# Patient Record
Sex: Female | Born: 1970 | Race: Black or African American | Hispanic: No | Marital: Single | Smoking: Current every day smoker
Health system: Southern US, Community
[De-identification: ages and names within clinical notes are randomized; demographics above are authoritative.]

## PROBLEM LIST (undated history)

## (undated) DIAGNOSIS — I10 Essential (primary) hypertension: Secondary | ICD-10-CM

## (undated) DIAGNOSIS — M1990 Unspecified osteoarthritis, unspecified site: Secondary | ICD-10-CM

## (undated) HISTORY — PX: DILATION AND CURETTAGE OF UTERUS: SHX78

## (undated) HISTORY — PX: CHOLECYSTECTOMY: SHX55

---

## 2013-10-03 ENCOUNTER — Emergency Department (HOSPITAL_COMMUNITY): Payer: Medicare Other

## 2013-10-03 ENCOUNTER — Emergency Department (HOSPITAL_COMMUNITY)
Admission: EM | Admit: 2013-10-03 | Discharge: 2013-10-03 | Disposition: A | Discharge status: Home / Self Care | Payer: Medicare Other | Attending: Emergency Medicine | Admitting: Emergency Medicine

## 2013-10-03 ENCOUNTER — Encounter (HOSPITAL_COMMUNITY): Payer: Self-pay | Admitting: Emergency Medicine

## 2013-10-03 DIAGNOSIS — R6883 Chills (without fever): Secondary | ICD-10-CM | POA: Insufficient documentation

## 2013-10-03 DIAGNOSIS — Z8739 Personal history of other diseases of the musculoskeletal system and connective tissue: Secondary | ICD-10-CM | POA: Insufficient documentation

## 2013-10-03 DIAGNOSIS — R0789 Other chest pain: Secondary | ICD-10-CM

## 2013-10-03 DIAGNOSIS — E669 Obesity, unspecified: Secondary | ICD-10-CM | POA: Insufficient documentation

## 2013-10-03 DIAGNOSIS — F17200 Nicotine dependence, unspecified, uncomplicated: Secondary | ICD-10-CM | POA: Insufficient documentation

## 2013-10-03 DIAGNOSIS — R071 Chest pain on breathing: Secondary | ICD-10-CM | POA: Insufficient documentation

## 2013-10-03 DIAGNOSIS — R05 Cough: Secondary | ICD-10-CM | POA: Insufficient documentation

## 2013-10-03 DIAGNOSIS — I10 Essential (primary) hypertension: Secondary | ICD-10-CM | POA: Insufficient documentation

## 2013-10-03 DIAGNOSIS — R059 Cough, unspecified: Secondary | ICD-10-CM | POA: Insufficient documentation

## 2013-10-03 HISTORY — DX: Essential (primary) hypertension: I10

## 2013-10-03 HISTORY — DX: Unspecified osteoarthritis, unspecified site: M19.90

## 2013-10-03 LAB — CBC
MCH: 29.7 pg (ref 26.0–34.0)
MCV: 87.3 fL (ref 78.0–100.0)
Platelets: 409 10*3/uL — ABNORMAL HIGH (ref 150–400)
RBC: 5.11 MIL/uL (ref 3.87–5.11)
RDW: 13.2 % (ref 11.5–15.5)
WBC: 9.4 10*3/uL (ref 4.0–10.5)

## 2013-10-03 LAB — BASIC METABOLIC PANEL
CO2: 22 mEq/L (ref 19–32)
Calcium: 9.8 mg/dL (ref 8.4–10.5)
Chloride: 100 mEq/L (ref 96–112)
Creatinine, Ser: 0.82 mg/dL (ref 0.50–1.10)
GFR calc Af Amer: >90 mL/min (ref >90)
Glucose, Bld: 105 mg/dL — ABNORMAL HIGH (ref 70–99)
Sodium: 134 mEq/L — ABNORMAL LOW (ref 135–145)

## 2013-10-03 LAB — POCT I-STAT TROPONIN I: Troponin i, poc: 0 ng/mL (ref 0.00–0.08)

## 2013-10-03 MED ORDER — KETOROLAC TROMETHAMINE 60 MG/2ML IM SOLN: 60.0000 mg | Freq: Once | INTRAMUSCULAR | Status: DC

## 2013-10-03 MED ORDER — KETOROLAC TROMETHAMINE 30 MG/ML IJ SOLN
30.0000 mg | Freq: Once | INTRAMUSCULAR | Status: AC
  Administered 2013-10-03: 30 mg via INTRAVENOUS
  Filled 2013-10-03: qty 1

## 2013-10-03 NOTE — ED Provider Notes (Signed)
CSN: [id number]     Arrival date & time 10/03/13  1413 History   First MD Initiated Contact with Patient 10/03/13 1502     Chief Complaint  Patient presents with  . Chest Pain   (Consider location/radiation/quality/duration/timing/severity/associated sxs/prior Treatment) HPI Comments: Patient is a 42 year old female with a past medical history of hypertension and arthritis who presents to the emergency department complaining of midsternal chest pain x2 days. Pain is nonradiating rated 4/10. She tried taking Tylenol with minimal relief. States she was stretching and moving her arms before the pain began. Pain worse with coughing, she has a slight productive cough with phlegm. Admits to chills, no fever. Denies sob, nausea, vomiting, abdominal pain, headaches. She is a smoker. No family hx of early heart disease. Denies ever having pain like this in the past. Despite triage summary patient denies feeling hot and sweaty.  Patient is a 42 y.o. female presenting with chest pain. The history is provided by the patient.  Chest Pain Associated symptoms: cough     Past Medical History  Diagnosis Date  . Hypertension   . Arthritis    Past Surgical History  Procedure Laterality Date  . Dilation and curettage of uterus    . Cholecystectomy     History reviewed. No pertinent family history. History  Substance Use Topics  . Smoking status: Current Every Day Smoker -- 0.50 packs/day    Types: Cigarettes  . Smokeless tobacco: Not on file  . Alcohol Use: Yes     Comment: social   OB History   Grav Para Term Preterm Abortions TAB SAB Ect Mult Living                 Review of Systems  Constitutional: Positive for chills.  Respiratory: Positive for cough.   Cardiovascular: Positive for chest pain.  All other systems reviewed and are negative.    Allergies  Sulfa antibiotics  Home Medications  No current outpatient prescriptions on file. BP 178/94  Pulse 67  Temp(Src) 98.4 F (36.9  C) (Oral)  Resp 16  SpO2 99%  LMP 10/03/2013 Physical Exam  Nursing note and vitals reviewed. Constitutional: She is oriented to person, place, and time. She appears well-developed and well-nourished. No distress.  Obese  HENT:  Head: Normocephalic and atraumatic.  Mouth/Throat: Oropharynx is clear and moist.  Eyes: Conjunctivae are normal.  Neck: Normal range of motion. Neck supple.  Cardiovascular: Normal rate, regular rhythm and normal heart sounds.   No extremity edema.  Pulmonary/Chest: Effort normal and breath sounds normal. She exhibits tenderness.    Abdominal: Soft. Bowel sounds are normal. There is no tenderness.  Musculoskeletal: Normal range of motion. She exhibits no edema.  Neurological: She is alert and oriented to person, place, and time.  Skin: Skin is warm and dry. She is not diaphoretic.  Psychiatric: She has a normal mood and affect. Her behavior is normal.    ED Course  Procedures (including critical care time) Labs Review Labs Reviewed  CBC - Abnormal; Notable for the following:    Hemoglobin 15.2 (*)    Platelets 409 (*)    All other components within normal limits  BASIC METABOLIC PANEL  POCT I-STAT TROPONIN I   Imaging Review Dg Chest Port 1 View  10/03/2013   CLINICAL DATA:  Shortness of breath.  EXAM: PORTABLE CHEST - 1 VIEW  COMPARISON:  None.  FINDINGS: The cardiac silhouette, mediastinal and hilar contours are normal. The lungs are clear. No pleural effusion.  IMPRESSION: No acute cardiopulmonary findings.   Electronically Signed   By: Loralie Champagne M.D.   On: 10/03/2013 15:04    EKG Interpretation   None      Date: 10/03/2013  Rate: 60  Rhythm: normal sinus rhythm  QRS Axis: normal  Intervals: normal  ST/T Wave abnormalities: normal  Conduction Disutrbances:none  Narrative Interpretation: NSR, septal infarct, age undetermined  Old EKG Reviewed: none available    MDM   1. Chest wall pain     Patient with reproducible  chest pain. She is well appearing and in NAD. Slightly hypertensive at 178/94. No hx of heart disease, PERC negative. Doubt CAD or PE. Most likely MSS. Will give toradol. Labs, CXR obtained in triage prior to patient being seen. Troponin negative. CXR clear. EKG without any sig findings.  3:54 PM Pain slightly improved with toradol. She is stable for discharge. Advised rest, ice, NSAIDs. Return precautions given. Patient states understanding of treatment care plan and is agreeable.    Trevor Mace, PA-C 10/03/13 1554

## 2013-10-03 NOTE — ED Notes (Signed)
Portable chest xray in progress.

## 2013-10-03 NOTE — ED Notes (Signed)
Pt c/o chest pain for the past two days.  Pt reports "feeling hot and sweating with the pain."  Denies SOB or fever.  Pt reports cough and chills.

## 2013-10-04 NOTE — ED Provider Notes (Signed)
Medical screening examination/treatment/procedure(s) were performed by non-physician practitioner and as supervising physician I was immediately available for consultation/collaboration.  EKG Interpretation   None         Shayn Madole E Isidro Monks, MD 10/04/13 1250 

## 2020-12-29 ENCOUNTER — Other Ambulatory Visit: Payer: Self-pay

## 2020-12-29 ENCOUNTER — Emergency Department (HOSPITAL_BASED_OUTPATIENT_CLINIC_OR_DEPARTMENT_OTHER): Payer: Medicare HMO

## 2020-12-29 ENCOUNTER — Emergency Department (HOSPITAL_BASED_OUTPATIENT_CLINIC_OR_DEPARTMENT_OTHER)
Admission: EM | Admit: 2020-12-29 | Discharge: 2020-12-29 | Disposition: A | Discharge status: Home / Self Care | Payer: Medicare HMO | Source: Home / Self Care | Attending: Emergency Medicine | Admitting: Emergency Medicine

## 2020-12-29 ENCOUNTER — Encounter (HOSPITAL_BASED_OUTPATIENT_CLINIC_OR_DEPARTMENT_OTHER): Payer: Self-pay

## 2020-12-29 ENCOUNTER — Emergency Department (HOSPITAL_BASED_OUTPATIENT_CLINIC_OR_DEPARTMENT_OTHER)
Admission: EM | Admit: 2020-12-29 | Discharge: 2020-12-29 | Disposition: A | Discharge status: Home / Self Care | Payer: Medicare HMO | Attending: Emergency Medicine | Admitting: Emergency Medicine

## 2020-12-29 DIAGNOSIS — Z79899 Other long term (current) drug therapy: Secondary | ICD-10-CM | POA: Insufficient documentation

## 2020-12-29 DIAGNOSIS — I10 Essential (primary) hypertension: Secondary | ICD-10-CM | POA: Insufficient documentation

## 2020-12-29 DIAGNOSIS — R002 Palpitations: Secondary | ICD-10-CM | POA: Diagnosis not present

## 2020-12-29 DIAGNOSIS — B349 Viral infection, unspecified: Secondary | ICD-10-CM | POA: Diagnosis not present

## 2020-12-29 DIAGNOSIS — R0602 Shortness of breath: Secondary | ICD-10-CM | POA: Diagnosis present

## 2020-12-29 DIAGNOSIS — R61 Generalized hyperhidrosis: Secondary | ICD-10-CM | POA: Insufficient documentation

## 2020-12-29 DIAGNOSIS — Z5321 Procedure and treatment not carried out due to patient leaving prior to being seen by health care provider: Secondary | ICD-10-CM | POA: Insufficient documentation

## 2020-12-29 DIAGNOSIS — F17210 Nicotine dependence, cigarettes, uncomplicated: Secondary | ICD-10-CM | POA: Insufficient documentation

## 2020-12-29 DIAGNOSIS — Z20822 Contact with and (suspected) exposure to covid-19: Secondary | ICD-10-CM | POA: Insufficient documentation

## 2020-12-29 LAB — CBC WITH DIFFERENTIAL/PLATELET
Abs Immature Granulocytes: 0.04 10*3/uL (ref 0.00–0.07)
Basophils Absolute: 0.1 10*3/uL (ref 0.0–0.1)
Basophils Relative: 0 %
Eosinophils Absolute: 0.1 10*3/uL (ref 0.0–0.5)
Eosinophils Relative: 1 %
HCT: 42.3 % (ref 36.0–46.0)
Hemoglobin: 13.7 g/dL (ref 12.0–15.0)
Immature Granulocytes: 0 %
Lymphocytes Relative: 18 %
Lymphs Abs: 2.4 10*3/uL (ref 0.7–4.0)
MCH: 25.6 pg — ABNORMAL LOW (ref 26.0–34.0)
MCHC: 32.4 g/dL (ref 30.0–36.0)
MCV: 79.1 fL — ABNORMAL LOW (ref 80.0–100.0)
Monocytes Absolute: 0.7 10*3/uL (ref 0.1–1.0)
Monocytes Relative: 5 %
Neutro Abs: 10 10*3/uL — ABNORMAL HIGH (ref 1.7–7.7)
Neutrophils Relative %: 76 %
Platelets: 544 10*3/uL — ABNORMAL HIGH (ref 150–400)
RBC: 5.35 MIL/uL — ABNORMAL HIGH (ref 3.87–5.11)
RDW: 15.2 % (ref 11.5–15.5)
WBC: 13.3 10*3/uL — ABNORMAL HIGH (ref 4.0–10.5)
nRBC: 0 % (ref 0.0–0.2)

## 2020-12-29 LAB — BASIC METABOLIC PANEL
Anion gap: 13 (ref 5–15)
BUN: 11 mg/dL (ref 6–20)
CO2: 22 mmol/L (ref 22–32)
Calcium: 9.6 mg/dL (ref 8.9–10.3)
Chloride: 102 mmol/L (ref 98–111)
Creatinine, Ser: 0.74 mg/dL (ref 0.44–1.00)
GFR, Estimated: >60 mL/min (ref >60)
Glucose, Bld: 111 mg/dL — ABNORMAL HIGH (ref 70–99)
Potassium: 3 mmol/L — ABNORMAL LOW (ref 3.5–5.1)
Sodium: 137 mmol/L (ref 135–145)

## 2020-12-29 LAB — TROPONIN I (HIGH SENSITIVITY): Troponin I (High Sensitivity): 2 ng/L (ref <18)

## 2020-12-29 MED ORDER — ALBUTEROL SULFATE HFA 108 (90 BASE) MCG/ACT IN AERS: 1.0000 | INHALATION_SPRAY | Freq: Four times a day (QID) | RESPIRATORY_TRACT | 0 refills | Status: AC | PRN

## 2020-12-29 MED ORDER — ALBUTEROL SULFATE HFA 108 (90 BASE) MCG/ACT IN AERS
2.0000 | INHALATION_SPRAY | Freq: Once | RESPIRATORY_TRACT | Status: AC
  Administered 2020-12-29: 2 via RESPIRATORY_TRACT
  Filled 2020-12-29: qty 6.7

## 2020-12-29 NOTE — ED Notes (Signed)
Per registration, pt left after triage.

## 2020-12-29 NOTE — ED Provider Notes (Signed)
[facility] EMERGENCY DEPARTMENT Provider Note   CSN: [id number] Arrival date & time: 12/29/20  1557     History Chief Complaint  Patient presents with  . Shortness of Breath    Sonia Gonzalez is a 50 y.o. female w/ hx of iron deficiency anemia, HTN, reflux, presenting to ED with SOB.  Patient reports onset of symptoms around 10 am today.  She began to feel SOB, chest tightness, lightheaded, nausea, with a loose bowel movement.  Some chills at home.  She came to the ED around 2 pm, but left after triage because she was feeling better.  Upon getting home she again felt like she couldn't breathe well and returned to the ED.  She reports she smoked a marijuana joint around 0600 this morning, and smokes about 3-4 times per week, and has never had this reaction before.    Upon further questioning she states she has felt off for a few days, some congestion, no sore throat, some intermittent headaches.  She reports a hx of hypokalemia and takes K pills daily.  She has not had the Covid vaccinations.  No hemoptysis or asymmetric LE edema. Patient denies personal or family history of DVT or PE. No recent hormone use (including OCP); travel for >6 hours; prolonged immobilization for greater than 3 days; surgeries or trauma in the last 4 weeks; or malignancy with treatment within 6 months.   HPI     Past Medical History:  Diagnosis Date  . Arthritis   . Hypertension     There are no problems to display for this patient.   Past Surgical History:  Procedure Laterality Date  . CHOLECYSTECTOMY    . DILATION AND CURETTAGE OF UTERUS       OB History   No obstetric history on file.     History reviewed. No pertinent family history.  Social History   Tobacco Use  . Smoking status: Current Every Day Smoker    Packs/day: 0.50    Types: Cigarettes  Substance Use Topics  . Alcohol use: Yes    Comment: social  . Drug use: Yes    Types: Marijuana    Home  Medications Prior to Admission medications   Medication Sig Start Date End Date Taking? Authorizing Provider  albuterol (VENTOLIN HFA) 108 (90 Base) MCG/ACT inhaler Inhale 1-2 puffs into the lungs every 6 (six) hours as needed for wheezing or shortness of breath. 12/29/20  Yes Demetris Meinhardt, Kermit Balo, MD  Acetaminophen (TYLENOL) 325 MG CAPS Take by mouth.    [provider]  acetaminophen (TYLENOL) 325 MG tablet Take 650 mg by mouth every 6 (six) hours as needed for pain.    [provider]  amLODipine (NORVASC) 10 MG tablet Take 1 tablet by mouth daily. 01/31/20   [provider]  chlorthalidone (HYGROTON) 25 MG tablet Take by mouth.    [provider]  Cholecalciferol 25 MCG (1000 UT) tablet Take by mouth.    [provider]  famotidine (PEPCID) 40 MG tablet Take 20 mg by mouth. 02/08/17   [provider]  ibuprofen (ADVIL,MOTRIN) 200 MG tablet Take 200 mg by mouth every 6 (six) hours as needed for pain.    [provider]  Melatonin 10 MG TABS Take 10 mg by mouth.    [provider]  potassium chloride SA (KLOR-CON) 20 MEQ tablet Take by mouth.    [provider]    Allergies    Sulfa antibiotics  Review of  Systems   Review of Systems  Constitutional: Positive for chills and fatigue. Negative for fever.  HENT: Positive for congestion. Negative for sore throat.   Eyes: Negative for pain and visual disturbance.  Respiratory: Positive for shortness of breath. Negative for cough.   Cardiovascular: Positive for palpitations. Negative for chest pain.  Gastrointestinal: Positive for diarrhea and nausea. Negative for vomiting.  Genitourinary: Negative for dysuria and hematuria.  Musculoskeletal: Positive for myalgias. Negative for back pain.  Skin: Negative for color change and rash.  Neurological: Positive for headaches. Negative for syncope.  All other systems reviewed and are negative.   Physical Exam Updated Vital  Signs BP 123/81 (BP Location: Right Arm)   Pulse 85   Temp 98.5 F (36.9 C) (Oral)   Resp 18   Ht 5\' 4"  (1.626 m)   Wt 102.6 kg   SpO2 99%   BMI 38.82 kg/m   Physical Exam Constitutional:      General: She is not in acute distress.    Appearance: She is obese.  HENT:     Head: Normocephalic and atraumatic.  Eyes:     Conjunctiva/sclera: Conjunctivae normal.     Pupils: Pupils are equal, round, and reactive to light.  Cardiovascular:     Rate and Rhythm: Normal rate and regular rhythm.  Pulmonary:     Effort: Pulmonary effort is normal. No respiratory distress.     Comments: 99% O2 saturation on room air No wheezing on exam Abdominal:     General: There is no distension.     Tenderness: There is no abdominal tenderness.  Skin:    General: Skin is warm and dry.  Neurological:     General: No focal deficit present.     Mental Status: She is alert. Mental status is at baseline.  Psychiatric:        Mood and Affect: Mood normal.        Behavior: Behavior normal.     ED Results / Procedures / Treatments   Labs (all labs ordered are listed, but only abnormal results are displayed) Labs Reviewed  BASIC METABOLIC PANEL - Abnormal; Notable for the following components:      Result Value   Potassium 3.0 (*)    Glucose, Bld 111 (*)    All other components within normal limits  CBC WITH DIFFERENTIAL/PLATELET - Abnormal; Notable for the following components:   WBC 13.3 (*)    RBC 5.35 (*)    MCV 79.1 (*)    MCH 25.6 (*)    Platelets 544 (*)    Neutro Abs 10.0 (*)    All other components within normal limits  SARS CORONAVIRUS 2 (TAT 6-24 HRS)  TROPONIN I (HIGH SENSITIVITY)    EKG None  Radiology DG Chest Portable 1 View  Result Date: 12/29/2020 CLINICAL DATA:  50 year old female with history of shortness of breath. EXAM: PORTABLE CHEST 1 VIEW COMPARISON:  Chest x-ray 05/24/2020. FINDINGS: Lung volumes are normal. No consolidative airspace disease. No pleural  effusions. No pneumothorax. No pulmonary nodule or mass noted. Pulmonary vasculature and the cardiomediastinal silhouette are within normal limits. IMPRESSION: No radiographic evidence of acute cardiopulmonary disease. Electronically Signed   By: 05/26/2020 M.D.   On: 12/29/2020 16:54    Procedures Procedures (including critical care time)  Medications Ordered in ED Medications  albuterol (VENTOLIN HFA) 108 (90 Base) MCG/ACT inhaler 2 puff (2 puffs Inhalation Given 12/29/20 1649)    ED Course  I have reviewed the triage  vital signs and the nursing notes.  Pertinent labs & imaging results that were available during my care of the patient were reviewed by me and considered in my medical decision making (see chart for details).  50 yo female here for SOB, viral type syndrome  DDx includes covid-19 vs other virus vs anemia vs electrolyte derangement vs PNA vs other  No respiratory distress on exam, breathing comfortably on room air  Labs ordered and reviewed showing WBC 13.3, K 3.0, Trop 2 DG chest ordered and reviewed showing no acute processes ECG personally reviewed showing sinus rhythm with no acute ischemic findings Albuterol ordered for SOB, wheezing  Following workup, I had a lower suspicion for ACS, PE.  I felt K could be replenished at home with PCP f/u.    Clinical Course as of 12/30/20 0100  Sat Dec 29, 2020  1943 Feeling better with inhaler.  Discussed doubling K dose for 2 weeks, buying pulse ox to monitor O2 sat at home, and f/u on covid results tonight or tomorrow. [MT]    Clinical Course User Index [MT] Nickolus Wadding, Kermit Balo, MD    Final Clinical Impression(s) / ED Diagnoses Final diagnoses:  Shortness of breath  Viral syndrome    Rx / DC Orders ED Discharge Orders         Ordered    albuterol (VENTOLIN HFA) 108 (90 Base) MCG/ACT inhaler  Every 6 hours PRN        12/29/20 1946           Terald Sleeper, MD 12/30/20 0100

## 2020-12-29 NOTE — ED Triage Notes (Addendum)
Pt was here earlier but left because she felt better, went to lie down at home when she again felt short of breath, nauseous, dizzy and had palpitations. States she has had new stressors in her life. Also states she has been having chills. Had an EKG earlier today.

## 2020-12-29 NOTE — ED Triage Notes (Signed)
Arrived by POV, states having difficulty "catching my breath" , states was sitting down, playing a game on the phone. Now feels better. Felt some heart palpations and had some diaphoresis. Now feels better

## 2020-12-29 NOTE — Discharge Instructions (Addendum)
Instructions  Follow up on your covid result tomorrow morning.  If you are positive you should quarantine for 10 days at home from the onset of your symptoms.  You may feel sick for 10-14 days.    You can use the albuterol inhaler if you feel it is helping you breathe better, using it once every 4-6 hours.  I prescribed a refill if you need it.  You should DOUBLE your potassium dose for the next 2 weeks because your potassium was low today.    Finally, we talked about buying a pulse oximeter online or at a pharmacy.  Use this to track your oxygen level.  If your level ever drops below 90% at rest, or you feel like you simply are working too hard to breathe, you should return to the ER immediately.  There may be further tests or care you need in the ER at that point.

## 2020-12-30 LAB — SARS CORONAVIRUS 2 (TAT 6-24 HRS): SARS Coronavirus 2: NEGATIVE

## 2021-05-18 ENCOUNTER — Emergency Department (HOSPITAL_BASED_OUTPATIENT_CLINIC_OR_DEPARTMENT_OTHER): Payer: Medicare HMO

## 2021-05-18 ENCOUNTER — Emergency Department (HOSPITAL_BASED_OUTPATIENT_CLINIC_OR_DEPARTMENT_OTHER)
Admission: EM | Admit: 2021-05-18 | Discharge: 2021-05-18 | Disposition: A | Discharge status: Home / Self Care | Payer: Medicare HMO | Attending: Emergency Medicine | Admitting: Emergency Medicine

## 2021-05-18 ENCOUNTER — Encounter (HOSPITAL_BASED_OUTPATIENT_CLINIC_OR_DEPARTMENT_OTHER): Payer: Self-pay | Admitting: Emergency Medicine

## 2021-05-18 ENCOUNTER — Other Ambulatory Visit: Payer: Self-pay

## 2021-05-18 DIAGNOSIS — Z79899 Other long term (current) drug therapy: Secondary | ICD-10-CM | POA: Insufficient documentation

## 2021-05-18 DIAGNOSIS — F17210 Nicotine dependence, cigarettes, uncomplicated: Secondary | ICD-10-CM | POA: Diagnosis not present

## 2021-05-18 DIAGNOSIS — I10 Essential (primary) hypertension: Secondary | ICD-10-CM | POA: Diagnosis not present

## 2021-05-18 DIAGNOSIS — R1031 Right lower quadrant pain: Secondary | ICD-10-CM | POA: Diagnosis not present

## 2021-05-18 LAB — URINALYSIS, ROUTINE W REFLEX MICROSCOPIC
Bilirubin Urine: NEGATIVE
Glucose, UA: NEGATIVE mg/dL
Hgb urine dipstick: NEGATIVE
Ketones, ur: NEGATIVE mg/dL
Leukocytes,Ua: NEGATIVE
Nitrite: NEGATIVE
Protein, ur: NEGATIVE mg/dL
Specific Gravity, Urine: <1.005 — ABNORMAL LOW (ref 1.005–1.030)
pH: 6 (ref 5.0–8.0)

## 2021-05-18 LAB — CBC
HCT: 40.4 % (ref 36.0–46.0)
Hemoglobin: 13.2 g/dL (ref 12.0–15.0)
MCH: 27.6 pg (ref 26.0–34.0)
MCHC: 32.7 g/dL (ref 30.0–36.0)
MCV: 84.5 fL (ref 80.0–100.0)
Platelets: 386 10*3/uL (ref 150–400)
RBC: 4.78 MIL/uL (ref 3.87–5.11)
RDW: 14.5 % (ref 11.5–15.5)
WBC: 9.6 10*3/uL (ref 4.0–10.5)
nRBC: 0 % (ref 0.0–0.2)

## 2021-05-18 LAB — COMPREHENSIVE METABOLIC PANEL
ALT: 13 U/L (ref 0–44)
AST: 17 U/L (ref 15–41)
Albumin: 3.9 g/dL (ref 3.5–5.0)
Alkaline Phosphatase: 66 U/L (ref 38–126)
Anion gap: 5 (ref 5–15)
BUN: 8 mg/dL (ref 6–20)
CO2: 25 mmol/L (ref 22–32)
Calcium: 9 mg/dL (ref 8.9–10.3)
Chloride: 108 mmol/L (ref 98–111)
Creatinine, Ser: 0.78 mg/dL (ref 0.44–1.00)
GFR, Estimated: >60 mL/min (ref >60)
Glucose, Bld: 106 mg/dL — ABNORMAL HIGH (ref 70–99)
Potassium: 3.5 mmol/L (ref 3.5–5.1)
Sodium: 138 mmol/L (ref 135–145)
Total Bilirubin: 0.3 mg/dL (ref 0.3–1.2)
Total Protein: 7 g/dL (ref 6.5–8.1)

## 2021-05-18 LAB — LIPASE, BLOOD: Lipase: 32 U/L (ref 11–51)

## 2021-05-18 NOTE — Discharge Instructions (Addendum)
Your CT scan did not show a reason for your abdominal pain.  You may have adhesions.  You have been given information regarding this diagnosis.  These are small scars that can occur after a surgery.  I recommend you follow-up with your gynecologist to discuss the character and location of your pain to see if this is a possibility.  Return to the emergency department if you have new worsening or concerning symptoms.  You may try over-the-counter ibuprofen and Tylenol for pain as needed.

## 2021-05-18 NOTE — ED Notes (Signed)
Attempted to get bloodwork without success ?

## 2021-05-18 NOTE — ED Provider Notes (Signed)
[facility] EMERGENCY DEPARTMENT Provider Note   CSN: [id number] Arrival date & time: 05/18/21  1531     History Chief Complaint  Patient presents with   Abdominal Pain    Sonia Gonzalez is a 50 y.o. female.  HPI Patient reports he has been getting right lower abdominal pain for several weeks.  It has been coming and going.  Its oftentimes sharp and radiates around from the lower back or flank down to the lower abdomen.  No vomiting, no diarrhea, no constipation.  Patient has had a hysterectomy but ovaries remain.  She reports she is occasionally sexually active.  Denies any vaginal discharge or bleeding.  No pain burning urgency with urination.  No blood in the urine.  Patient had upper and lower endoscopies about 4 weeks ago with no abnormal findings.  He denies any prior CT scan for this problem    Past Medical History:  Diagnosis Date   Arthritis    Hypertension     There are no problems to display for this patient.   Past Surgical History:  Procedure Laterality Date   CHOLECYSTECTOMY     DILATION AND CURETTAGE OF UTERUS       OB History   No obstetric history on file.     No family history on file.  Social History   Tobacco Use   Smoking status: Every Day    Packs/day: 0.50    Pack years: 0.00    Types: Cigarettes  Substance Use Topics   Alcohol use: Yes    Comment: social   Drug use: Yes    Types: Marijuana    Home Medications Prior to Admission medications   Medication Sig Start Date End Date Taking? Authorizing Provider  Acetaminophen (TYLENOL) 325 MG CAPS Take by mouth.    [provider]  acetaminophen (TYLENOL) 325 MG tablet Take 650 mg by mouth every 6 (six) hours as needed for pain.    [provider]  albuterol (VENTOLIN HFA) 108 (90 Base) MCG/ACT inhaler Inhale 1-2 puffs into the lungs every 6 (six) hours as needed for wheezing or shortness of breath. 12/29/20   Trifan, Kermit Balo, MD  amLODipine (NORVASC) 10 MG  tablet Take 1 tablet by mouth daily. 01/31/20   [provider]  chlorthalidone (HYGROTON) 25 MG tablet Take by mouth.    [provider]  Cholecalciferol 25 MCG (1000 UT) tablet Take by mouth.    [provider]  famotidine (PEPCID) 40 MG tablet Take 20 mg by mouth. 02/08/17   [provider]  ibuprofen (ADVIL,MOTRIN) 200 MG tablet Take 200 mg by mouth every 6 (six) hours as needed for pain.    [provider]  Melatonin 10 MG TABS Take 10 mg by mouth.    [provider]  potassium chloride SA (KLOR-CON) 20 MEQ tablet Take by mouth.    [provider]    Allergies    Sulfa antibiotics, Sulfacetamide, Azithromycin, and Tramadol  Review of Systems   Review of Systems 10 systems reviewed and negative except as per HPI Physical Exam Updated Vital Signs BP 134/87 (BP Location: Left Arm)   Pulse 66   Temp 98.4 F (36.9 C) (Oral)   Resp 16   Ht 5\' 4"  (1.626 m)   Wt 94.8 kg   LMP 10/03/2013   SpO2 100%   BMI 35.87 kg/m   Physical Exam Constitutional:      Appearance: Normal appearance.  HENT:  Mouth/Throat:     Pharynx: Oropharynx is clear.  Eyes:     Extraocular Movements: Extraocular movements intact.     Conjunctiva/sclera: Conjunctivae normal.  Cardiovascular:     Rate and Rhythm: Normal rate and regular rhythm.  Pulmonary:     Effort: Pulmonary effort is normal.     Breath sounds: Normal breath sounds.  Abdominal:     Comments: Abdomen soft palpation no guarding.  Some discomfort at the CVA angle on the right.  Also low right lower quadrant and suprapubic mildly tender without guarding.  Upper abdomen nontender.  Musculoskeletal:        General: No swelling or tenderness. Normal range of motion.     Right lower leg: No edema.     Left lower leg: No edema.  Skin:    General: Skin is warm and dry.  Neurological:     General: No focal deficit present.     Mental Status: She is alert and oriented to person,  place, and time.     Motor: No weakness.    ED Results / Procedures / Treatments   Labs (all labs ordered are listed, but only abnormal results are displayed) Labs Reviewed  COMPREHENSIVE METABOLIC PANEL - Abnormal; Notable for the following components:      Result Value   Glucose, Bld 106 (*)    All other components within normal limits  URINALYSIS, ROUTINE W REFLEX MICROSCOPIC - Abnormal; Notable for the following components:   Color, Urine STRAW (*)    Specific Gravity, Urine <1.005 (*)    All other components within normal limits  LIPASE, BLOOD  CBC    EKG None  Radiology CT ABDOMEN PELVIS WO CONTRAST  Result Date: 05/18/2021 CLINICAL DATA:  Right lower quadrant abdominal pain. EXAM: CT ABDOMEN AND PELVIS WITHOUT CONTRAST TECHNIQUE: Multidetector CT imaging of the abdomen and pelvis was performed following the standard protocol without IV contrast. COMPARISON:  None. FINDINGS: Lower chest: The lung bases are clear of acute process. No pleural effusion or pulmonary lesions. The heart is normal in size. No pericardial effusion. The distal esophagus and aorta are unremarkable. Hepatobiliary: No hepatic lesions are identified without contrast. No intrahepatic biliary dilatation. The gallbladder is surgically absent. No common bile duct dilatation. Pancreas: No mass, inflammation or ductal dilatation. Spleen: Normal size.  No focal lesions. Adrenals/Urinary Tract: Adrenal glands are unremarkable. There are small fatty lesions associated with the right kidney. Some of these extend to the cortex and are somewhat linear. These could be fatty clefts. Small angiomyolipoma is are also possible. No renal or obstructing ureteral calculi. No worrisome renal lesions. The bladder is unremarkable. Stomach/Bowel: The stomach, duodenum, small bowel and colon are unremarkable. No acute inflammatory changes, mass lesions or obstructive findings. The terminal ileum is normal. The appendix is normal.  Vascular/Lymphatic: The aorta demonstrates minimal scattered calcifications but no aneurysm. No mesenteric or retroperitoneal mass or adenopathy the. Reproductive: The uterus is surgically absent. Both ovaries are still present and appear normal. Other: No pelvic mass or adenopathy. No free pelvic fluid collections. No inguinal mass or adenopathy. No abdominal wall hernia or subcutaneous lesions. Musculoskeletal: No significant bony findings. IMPRESSION: 1. No acute abdominal/pelvic findings, mass lesions or adenopathy. 2. Status post cholecystectomy. No biliary dilatation. 3. Small fatty lesions associated with the right kidney could be fatty clefts. Small angiomyolipomas are also possible. 4. Status post hysterectomy. Both ovaries are still present and appear normal. Aortic Atherosclerosis (ICD10-I70.0). Electronically Signed   By: Rudie Meyer  M.D.   On: 05/18/2021 17:53    Procedures Procedures   Medications Ordered in ED Medications - No data to display  ED Course  I have reviewed the triage vital signs and the nursing notes.  Pertinent labs & imaging results that were available during my care of the patient were reviewed by me and considered in my medical decision making (see chart for details).    MDM Rules/Calculators/A&P                          CT scan does not identify cause for the patient's right lower quadrant pain.  She reports it comes on quite quickly and will be sharp and lancinating in quality.  She is clinically well in appearance.  Abdominal exam is nonsurgical.  We discussed the possibility of adhesions.  Patient has had hysterectomy with ovaries remaining.  At this time, I recommend a follow-up visit with GYN.  Patient can continue to see gastroenterology per their recommendations. Final Clinical Impression(s) / ED Diagnoses Final diagnoses:  Right lower quadrant abdominal pain    Rx / DC Orders ED Discharge Orders     None        Arby Barrette, MD 05/18/21  1900

## 2021-05-18 NOTE — ED Triage Notes (Signed)
RLQ abdominal pain that radiates into lower back for the last few months.  Reports worse for the last three weeks.  Seen a UC today.  Sent here for ct scan.  Denies any urinary symptoms.

## 2022-11-05 IMAGING — CT CT ABD-PELV W/O CM
2 of 4 series · 16 of 46 positions shown, 18 images · non-contrast
Comparison: None.

CLINICAL DATA: Right lower quadrant abdominal pain.

EXAM:
CT ABDOMEN AND PELVIS WITHOUT CONTRAST
TECHNIQUE: Multidetector CT imaging of the abdomen and pelvis was performed
following the standard protocol without IV contrast.

[Series 2: axial st · axial · 0.88mm/px · z∈[-468,-28]mm · 13 of 98 slices shown, 15 images]
[im 5/98  soft-tissue]
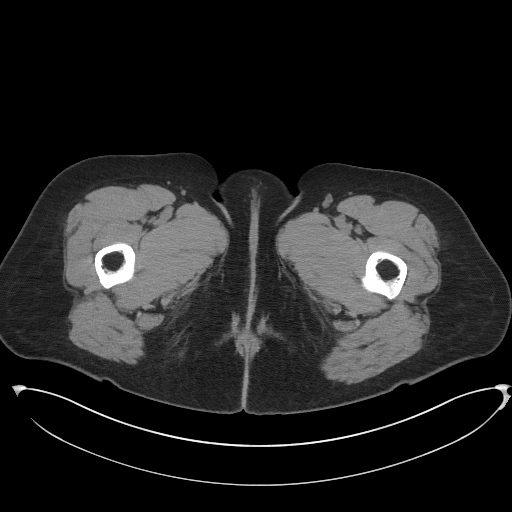
[im 5/98  bone]
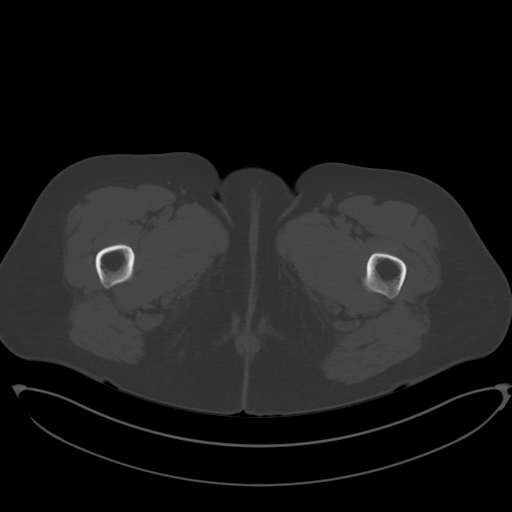
[im 13/98  soft-tissue]
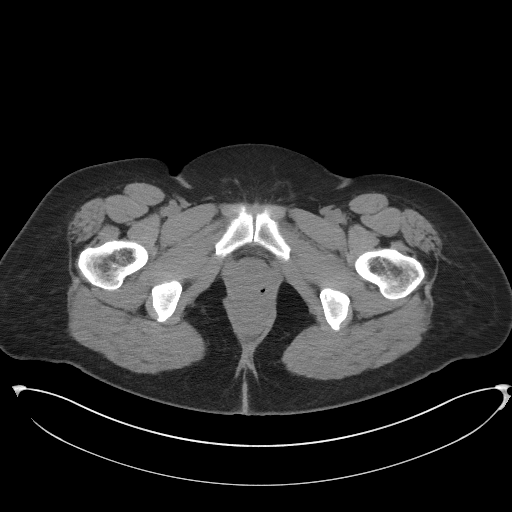
[im 22/98  soft-tissue]
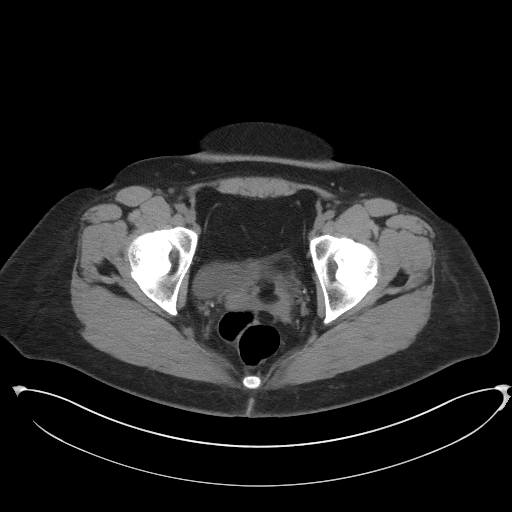
[im 26/98  soft-tissue]
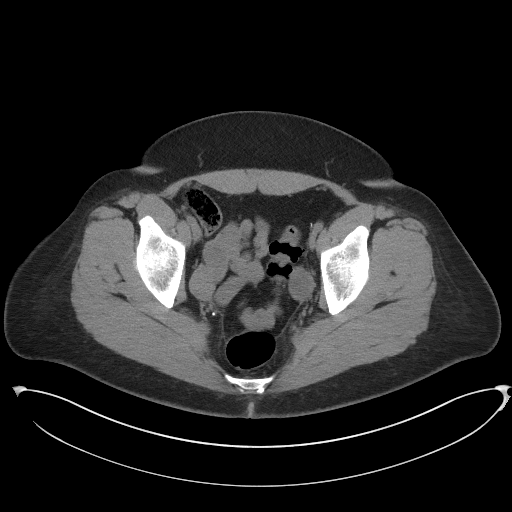
[im 34/98  soft-tissue]
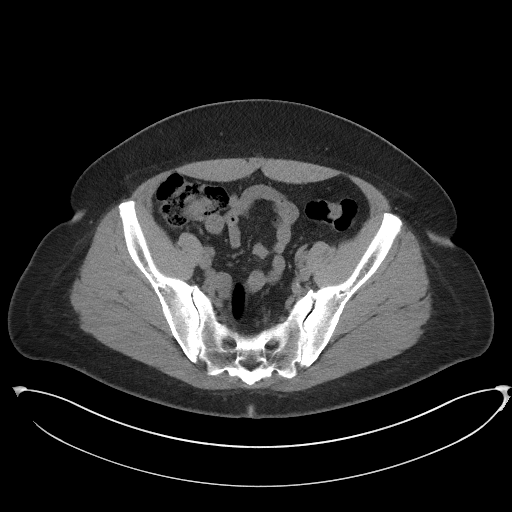
[im 43/98  soft-tissue]
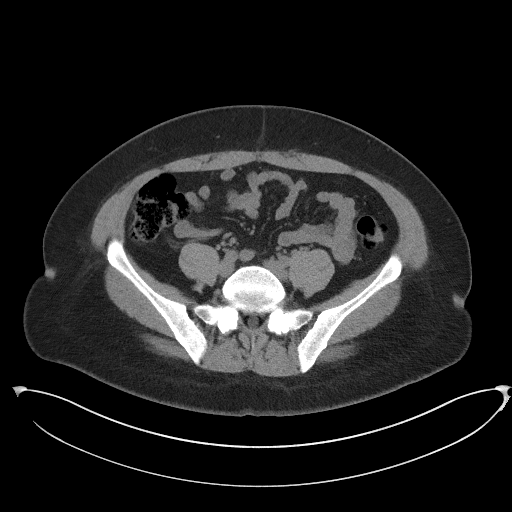
[im 51/98  soft-tissue]
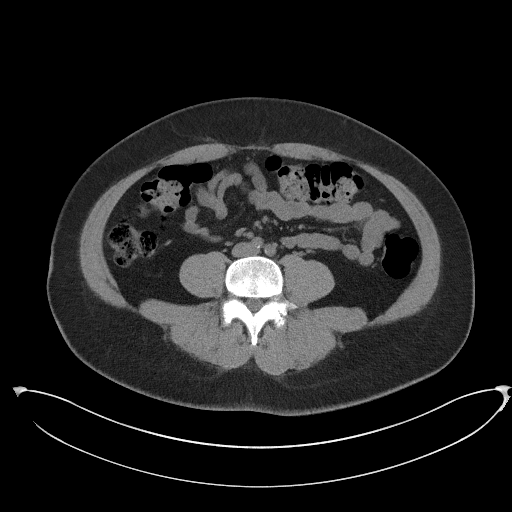
[im 55/98  soft-tissue]
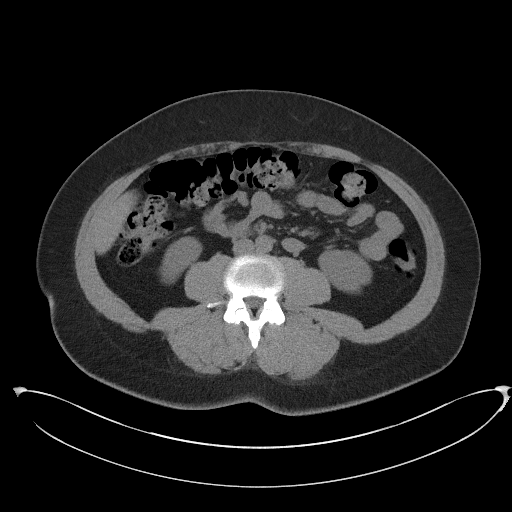
[im 64/98  soft-tissue]
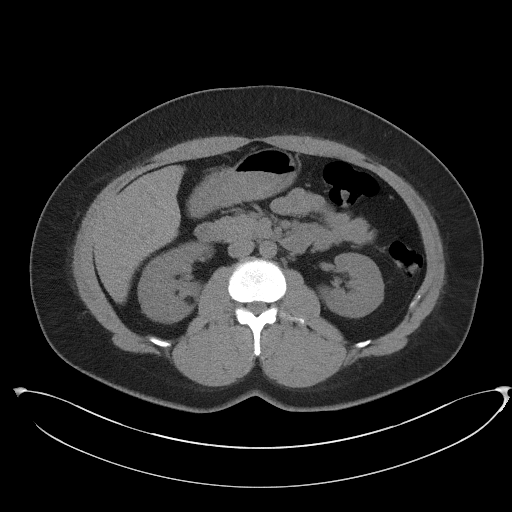
[im 64/98  bone]
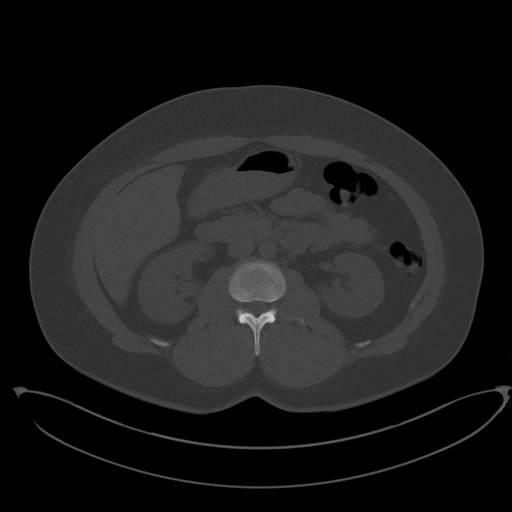
[im 72/98  soft-tissue]
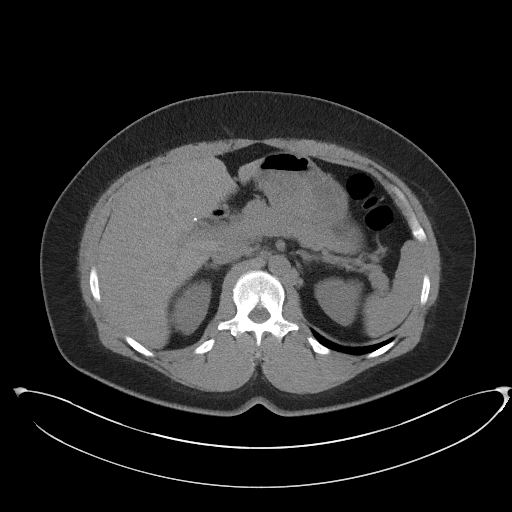
[im 76/98  soft-tissue]
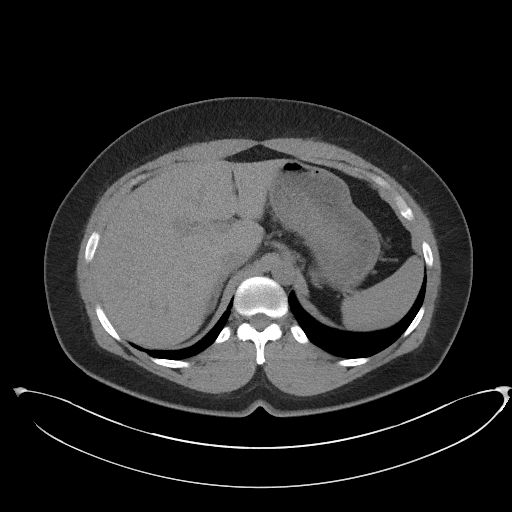
[im 85/98  soft-tissue]
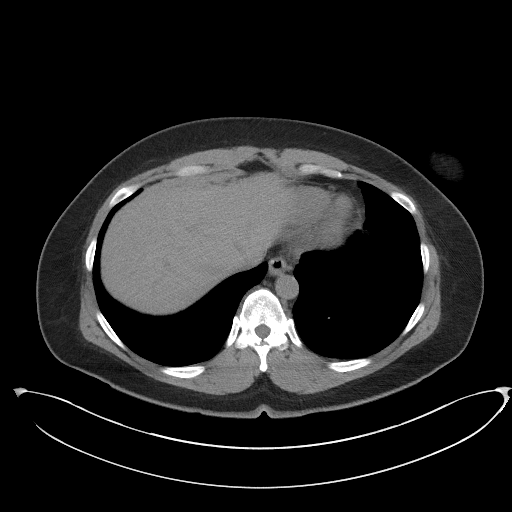
[im 93/98  soft-tissue]
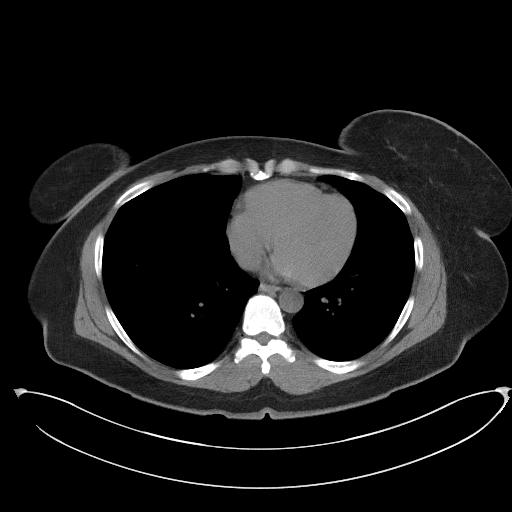

[Series 5: coronal st · coronal · 1.00mm/px · 3 of 104 slices shown]
[im 35/104  soft-tissue]
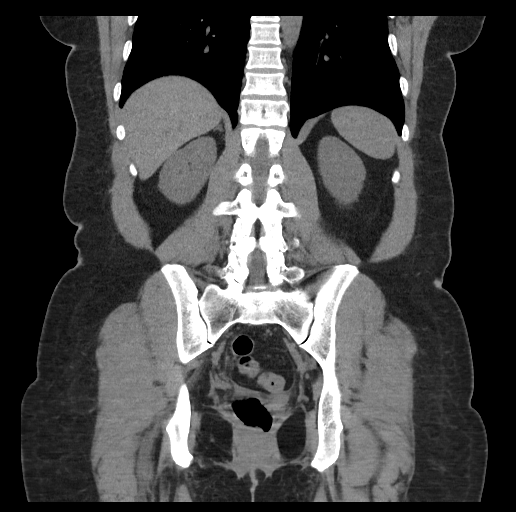
[im 46/104  soft-tissue]
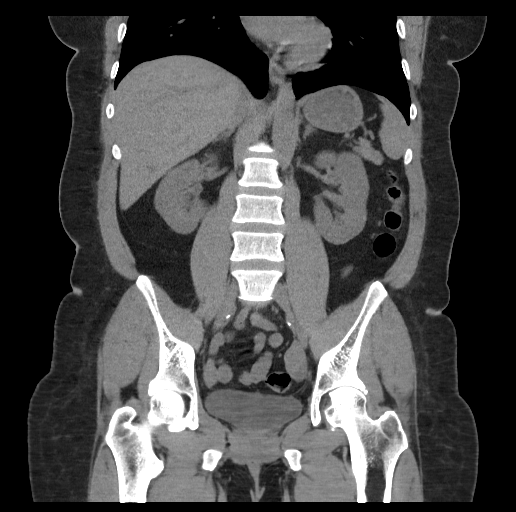
[im 58/104  soft-tissue]
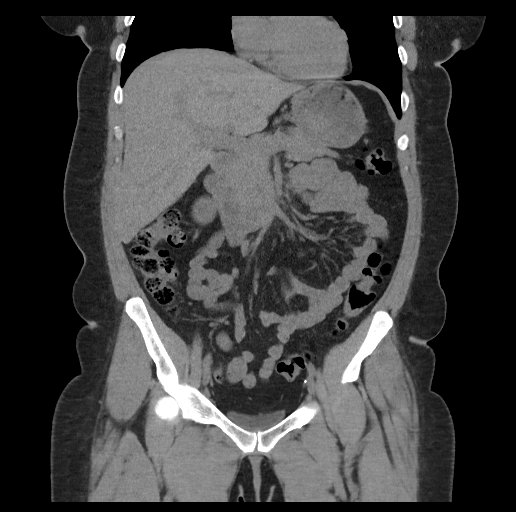

[16 of 46 positions shown; findings below may reference images not displayed]

FINDINGS: Lower chest: The lung bases are clear of acute process. No pleural
effusion or pulmonary lesions. The heart is normal in size. No
pericardial effusion. The distal esophagus and aorta are
unremarkable.

Hepatobiliary: No hepatic lesions are identified without contrast.
No intrahepatic biliary dilatation. The gallbladder is surgically
absent. No common bile duct dilatation.

Pancreas: No mass, inflammation or ductal dilatation.

Spleen: Normal size.  No focal lesions.

Adrenals/Urinary Tract: Adrenal glands are unremarkable.

There are small fatty lesions associated with the right kidney. Some
of these extend to the cortex and are somewhat linear. These could
be fatty clefts. Small angiomyolipoma is are also possible. No renal
or obstructing ureteral calculi. No worrisome renal lesions. The
bladder is unremarkable.

Stomach/Bowel: The stomach, duodenum, small bowel and colon are
unremarkable. No acute inflammatory changes, mass lesions or
obstructive findings. The terminal ileum is normal. The appendix is
normal.

Vascular/Lymphatic: The aorta demonstrates minimal scattered
calcifications but no aneurysm. No mesenteric or retroperitoneal
mass or adenopathy the.

Reproductive: The uterus is surgically absent. Both ovaries are
still present and appear normal.

Other: No pelvic mass or adenopathy. No free pelvic fluid
collections. No inguinal mass or adenopathy. No abdominal wall
hernia or subcutaneous lesions.

Musculoskeletal: No significant bony findings.
IMPRESSION: 1. No acute abdominal/pelvic findings, mass lesions or adenopathy.
2. Status post cholecystectomy. No biliary dilatation.
3. Small fatty lesions associated with the right kidney could be
fatty clefts. Small angiomyolipomas are also possible.
4. Status post hysterectomy. Both ovaries are still present and
appear normal.

Aortic Atherosclerosis ([id number]).
